# Patient Record
Sex: Male | Born: 2000 | Race: Black or African American | Hispanic: No | Marital: Single | Smoking: Never smoker
Health system: Southern US, Community
[De-identification: ages and names within clinical notes are randomized; demographics above are authoritative.]

## PROBLEM LIST (undated history)

## (undated) DIAGNOSIS — J45909 Unspecified asthma, uncomplicated: Secondary | ICD-10-CM

---
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
## 2022-07-24 ENCOUNTER — Emergency Department
Admission: EM | Admit: 2022-07-24 | Discharge: 2022-07-24 | Disposition: A | Discharge status: Home / Self Care | Payer: Medicaid Other | Attending: Emergency Medicine | Admitting: Emergency Medicine

## 2022-07-24 ENCOUNTER — Other Ambulatory Visit: Payer: Self-pay

## 2022-07-24 DIAGNOSIS — R42 Dizziness and giddiness: Secondary | ICD-10-CM

## 2022-07-24 LAB — COMPREHENSIVE METABOLIC PANEL
ALT: 11 U/L (ref 0–44)
AST: 18 U/L (ref 15–41)
Albumin: 4.6 g/dL (ref 3.5–5.0)
Alkaline Phosphatase: 55 U/L (ref 38–126)
Anion gap: 6 (ref 5–15)
BUN: 8 mg/dL (ref 6–20)
CO2: 27 mmol/L (ref 22–32)
Calcium: 9.2 mg/dL (ref 8.9–10.3)
Chloride: 108 mmol/L (ref 98–111)
Creatinine, Ser: 0.85 mg/dL (ref 0.61–1.24)
GFR, Estimated: >60 mL/min (ref >60)
Glucose, Bld: 85 mg/dL (ref 70–99)
Potassium: 4 mmol/L (ref 3.5–5.1)
Sodium: 141 mmol/L (ref 135–145)
Total Bilirubin: 0.6 mg/dL (ref 0.3–1.2)
Total Protein: 7.1 g/dL (ref 6.5–8.1)

## 2022-07-24 LAB — CBC WITH DIFFERENTIAL/PLATELET
Abs Immature Granulocytes: 0.01 10*3/uL (ref 0.00–0.07)
Basophils Absolute: 0.1 10*3/uL (ref 0.0–0.1)
Basophils Relative: 2 %
Eosinophils Absolute: 0.6 10*3/uL — ABNORMAL HIGH (ref 0.0–0.5)
Eosinophils Relative: 7 %
HCT: 39.9 % (ref 39.0–52.0)
Hemoglobin: 12.8 g/dL — ABNORMAL LOW (ref 13.0–17.0)
Immature Granulocytes: 0 %
Lymphocytes Relative: 32 %
Lymphs Abs: 2.6 10*3/uL (ref 0.7–4.0)
MCH: 23.7 pg — ABNORMAL LOW (ref 26.0–34.0)
MCHC: 32.1 g/dL (ref 30.0–36.0)
MCV: 74 fL — ABNORMAL LOW (ref 80.0–100.0)
Monocytes Absolute: 0.5 10*3/uL (ref 0.1–1.0)
Monocytes Relative: 6 %
Neutro Abs: 4.3 10*3/uL (ref 1.7–7.7)
Neutrophils Relative %: 53 %
Platelets: 193 10*3/uL (ref 150–400)
RBC: 5.39 MIL/uL (ref 4.22–5.81)
RDW: 15.9 % — ABNORMAL HIGH (ref 11.5–15.5)
WBC: 8.1 10*3/uL (ref 4.0–10.5)
nRBC: 0 % (ref 0.0–0.2)

## 2022-07-24 LAB — URINALYSIS, ROUTINE W REFLEX MICROSCOPIC
Bilirubin Urine: NEGATIVE
Glucose, UA: NEGATIVE mg/dL
Hgb urine dipstick: NEGATIVE
Ketones, ur: NEGATIVE mg/dL
Leukocytes,Ua: NEGATIVE
Nitrite: NEGATIVE
Protein, ur: NEGATIVE mg/dL
Specific Gravity, Urine: 1.02 (ref 1.005–1.030)
pH: 5 (ref 5.0–8.0)

## 2022-07-24 LAB — TROPONIN I (HIGH SENSITIVITY): Troponin I (High Sensitivity): <2 ng/L (ref <18)

## 2022-07-24 NOTE — ED Provider Notes (Signed)
[facility] Provider Note    Event Date/Time   First MD Initiated Contact with Patient 07/24/22 1840     (approximate)   History   Dizziness   HPI  Gary King. is a 21 y.o. male with no reported past medical history who presents today for evaluation of intermittent lightheadedness.  Patient reports that he has noticed this may be twice a month for the past several months.  He reports that the episodes happen when he is at work, and standing for prolonged periods of time, or when he gets up from standing quickly.  He denies any syncope or loss of consciousness.  He denies any feelings of palpitations or chest pain.  He currently feels normal.  He reports that he smokes marijuana daily as well as vapes, no other substance use.  He denies any leg swelling or calf pain.  No personal or family history of PE or DVT.  There are no problems to display for this patient.         Physical Exam   Triage Vital Signs: ED Triage Vitals [07/24/22 1629]  Enc Vitals Group     BP 129/79     Pulse Rate 86     Resp 16     Temp 98.4 F (36.9 C)     Temp Source Oral     SpO2 99 %     Weight 171 lb 15.3 oz (78 kg)     Height 5\' 6"  (1.676 m)     Head Circumference      Peak Flow      Pain Score 0     Pain Loc      Pain Edu?      Excl. in [place]?     Most recent vital signs: Vitals:   07/24/22 1629 07/24/22 1913  BP: 129/79 107/68  Pulse: 86 63  Resp: 16 18  Temp: 98.4 F (36.9 C) 98.1 F (36.7 C)  SpO2: 99% 98%    Physical Exam Vitals and nursing note reviewed.  Constitutional:      General: Awake and alert. No acute distress.    Appearance: Normal appearance. The patient is normal weight.  HENT:     Head: Normocephalic and atraumatic.     Mouth: Mucous membranes are moist.  Eyes:     General: PERRL. Normal EOMs        Right eye: No discharge.        Left eye: No discharge.     Conjunctiva/sclera: Conjunctivae normal.  Cardiovascular:      Rate and Rhythm: Normal rate and regular rhythm.     Pulses: Normal pulses.     Heart sounds: Normal heart sounds Pulmonary:     Effort: Pulmonary effort is normal. No respiratory distress.     Breath sounds: Normal breath sounds.  Abdominal:     Abdomen is soft. There is no abdominal tenderness. No rebound or guarding. No distention. Musculoskeletal:        General: No swelling. Normal range of motion.     Cervical back: Normal range of motion and neck supple.  No lower extremity edema Skin:    General: Skin is warm and dry.     Capillary Refill: Capillary refill takes less than 2 seconds.     Findings: No rash.  Neurological:     Mental Status: The patient is awake and alert.  Neurological: GCS 15 alert and oriented x3 Normal speech, no expressive or receptive  aphasia or dysarthria Cranial nerves II through XII intact Normal visual fields 5 out of 5 strength in all 4 extremities with intact sensation throughout No extremity drift Normal finger-to-nose testing, no limb or truncal ataxia Negative Dix-Hallpike bilaterally     ED Results / Procedures / Treatments   Labs (all labs ordered are listed, but only abnormal results are displayed) Labs Reviewed  URINALYSIS, ROUTINE W REFLEX MICROSCOPIC - Abnormal; Notable for the following components:      Result Value   Color, Urine YELLOW (*)    APPearance CLEAR (*)    All other components within normal limits  CBC WITH DIFFERENTIAL/PLATELET - Abnormal; Notable for the following components:   Hemoglobin 12.8 (*)    MCV 74.0 (*)    MCH 23.7 (*)    RDW 15.9 (*)    Eosinophils Absolute 0.6 (*)    All other components within normal limits  COMPREHENSIVE METABOLIC PANEL  TROPONIN I (HIGH SENSITIVITY)  TROPONIN I (HIGH SENSITIVITY)     EKG     RADIOLOGY     PROCEDURES:  Critical Care performed:   Procedures   MEDICATIONS ORDERED IN ED: Medications - No data to display   IMPRESSION / MDM / [place] / ED COURSE  I reviewed the triage vital signs and the nursing notes.   Differential diagnosis includes, but is not limited to, arrhythmia, electrolyte disarray, symptomatic anemia, orthostasis, dehydration.  Patient is awake and alert, hemodynamically stable and afebrile.  He currently feels asymptomatic.  EKG obtained in triage is nonischemic, no arrhythmias, normal intervals.  Labs are also reassuring including negative troponin, normal electrolytes.  He does not appear to be clinically dehydrated.  Dix-Hallpike was negative bilaterally.  Dizziness is lightheaded nests, not vertigo rolled spinning.  No personal or family history of sudden cardiac death, no personal or family history of PE or DVT, no clinical signs or symptoms of PE or DVT at this time.  I recommended ensuring proper hydration and blood sugar, and if symptoms persist then it may behoove him to use a Holter monitor which she can arrange through his PCP, but I also gave him the appropriate information for cardiology.  We discussed strict return precautions and the importance of close outpatient follow-up.  Patient or stands and agrees with plan.  He was discharged in stable condition.   Patient's presentation is most consistent with acute complicated illness / injury requiring diagnostic workup.     FINAL CLINICAL IMPRESSION(S) / ED DIAGNOSES   Final diagnoses:  Lightheadedness     Rx / DC Orders   ED Discharge Orders     None        Note:  This document was prepared using Dragon voice recognition software and may include unintentional dictation errors.   Emeline Gins 07/24/22 1917    Rada Hay, MD 07/25/22 Pauline Aus

## 2022-07-24 NOTE — ED Triage Notes (Signed)
Pt presents via POV c/o dizzy spells for "months". Reports has not been evaluated for these spells previously. Reports pre/syncopal episodes while ambulating. Reports headaches following these events. A&O x4.

## 2022-07-24 NOTE — ED Provider Triage Note (Signed)
Emergency Medicine Provider Triage Evaluation Note  Gary King , a 21 y.o. male  was evaluated in triage.  Pt complains of "dizzy spells" that have been intermittent for several months. Reports that he will be walking around and then feel lightheaded and nauseated. Reports worse when he turns his head to the left. No syncope. Feels normal currently.  Review of Systems  Positive: dizzy Negative: Cp/ palpitations, pain  Physical Exam  There were no vitals taken for this visit. Gen:   Awake, no distress   Resp:  Normal effort  MSK:   Moves extremities without difficulty  Other:    Medical Decision Making  Medically screening exam initiated at 4:28 PM.  Appropriate orders placed.  Gary King. was informed that the remainder of the evaluation will be completed by another provider, this initial triage assessment does not replace that evaluation, and the importance of remaining in the ED until their evaluation is complete.     Marquette Old, PA-C 07/24/22 1630

## 2022-07-24 NOTE — Discharge Instructions (Addendum)
Your EKG and blood work are normal today.  Please ensure that you are properly hydrated and that you are eating throughout the day.  If your symptoms persist, then please follow-up with your outpatient provider to discuss using a Holter monitor for further evaluation.  You may also follow-up with a cardiologist for further evaluation.  Please return for any new, worsening, or change in symptoms or other concerns.  It was a pleasure caring for you today.
